# Patient Record
Sex: Female | Born: 1970 | Race: White | Marital: Married | Smoking: Never smoker
Health system: Southern US, Community
[De-identification: ages and names within clinical notes are randomized; demographics above are authoritative.]

## PROBLEM LIST (undated history)

## (undated) DIAGNOSIS — N921 Excessive and frequent menstruation with irregular cycle: Secondary | ICD-10-CM

## (undated) DIAGNOSIS — B965 Pseudomonas (aeruginosa) (mallei) (pseudomallei) as the cause of diseases classified elsewhere: Secondary | ICD-10-CM

## (undated) DIAGNOSIS — R519 Headache, unspecified: Secondary | ICD-10-CM

## (undated) DIAGNOSIS — E559 Vitamin D deficiency, unspecified: Secondary | ICD-10-CM

## (undated) HISTORY — DX: Morbid (severe) obesity due to excess calories: E66.01

## (undated) HISTORY — DX: Vitamin D deficiency, unspecified: E55.9

## (undated) HISTORY — DX: Excessive and frequent menstruation with irregular cycle: N92.1

## (undated) HISTORY — DX: Pseudomonas (aeruginosa) (mallei) (pseudomallei) as the cause of diseases classified elsewhere: B96.5

## (undated) HISTORY — DX: Headache, unspecified: R51.9

---

## 2019-05-31 ENCOUNTER — Other Ambulatory Visit: Payer: Self-pay | Admitting: Physician Assistant

## 2019-05-31 DIAGNOSIS — R519 Headache, unspecified: Secondary | ICD-10-CM

## 2019-06-07 ENCOUNTER — Ambulatory Visit
Admission: RE | Admit: 2019-06-07 | Discharge: 2019-06-07 | Disposition: A | Discharge status: Home / Self Care | Payer: Managed Care, Other (non HMO) | Source: Ambulatory Visit | Attending: Physician Assistant | Admitting: Physician Assistant

## 2019-06-07 DIAGNOSIS — R519 Headache, unspecified: Secondary | ICD-10-CM

## 2019-10-18 ENCOUNTER — Other Ambulatory Visit: Payer: Self-pay | Admitting: Physician Assistant

## 2019-12-01 ENCOUNTER — Other Ambulatory Visit: Payer: Self-pay | Admitting: Physician Assistant

## 2020-01-21 DIAGNOSIS — S90861A Insect bite (nonvenomous), right foot, initial encounter: Secondary | ICD-10-CM | POA: Diagnosis not present

## 2020-01-21 DIAGNOSIS — L03115 Cellulitis of right lower limb: Secondary | ICD-10-CM | POA: Diagnosis not present

## 2020-01-23 ENCOUNTER — Other Ambulatory Visit: Payer: Self-pay | Admitting: Physician Assistant

## 2020-01-23 MED ORDER — HYDROCHLOROTHIAZIDE 25 MG PO TABS: 25.0000 mg | ORAL_TABLET | Freq: Every day | ORAL | 0 refills | Status: DC

## 2020-01-31 ENCOUNTER — Other Ambulatory Visit: Payer: Self-pay | Admitting: Physician Assistant

## 2020-02-22 DIAGNOSIS — D225 Melanocytic nevi of trunk: Secondary | ICD-10-CM | POA: Diagnosis not present

## 2020-02-22 DIAGNOSIS — Z808 Family history of malignant neoplasm of other organs or systems: Secondary | ICD-10-CM | POA: Diagnosis not present

## 2020-02-22 DIAGNOSIS — L814 Other melanin hyperpigmentation: Secondary | ICD-10-CM | POA: Diagnosis not present

## 2020-02-22 DIAGNOSIS — L821 Other seborrheic keratosis: Secondary | ICD-10-CM | POA: Diagnosis not present

## 2020-02-29 ENCOUNTER — Other Ambulatory Visit: Payer: Self-pay

## 2020-03-05 ENCOUNTER — Other Ambulatory Visit: Payer: Self-pay | Admitting: Physician Assistant

## 2020-03-05 MED ORDER — HYDROCHLOROTHIAZIDE 25 MG PO TABS: 25.0000 mg | ORAL_TABLET | Freq: Every day | ORAL | 0 refills | Status: DC

## 2020-03-05 MED ORDER — VITAMIN D (ERGOCALCIFEROL) 1.25 MG (50000 UNIT) PO CAPS: ORAL_CAPSULE | ORAL | 0 refills | Status: DC

## 2020-03-06 ENCOUNTER — Other Ambulatory Visit: Payer: Self-pay

## 2020-03-06 ENCOUNTER — Ambulatory Visit: Payer: BC Managed Care – PPO | Admitting: Physician Assistant

## 2020-03-06 ENCOUNTER — Encounter: Payer: Self-pay | Admitting: Physician Assistant

## 2020-03-06 VITALS — BP 98/68 | HR 72 | Temp 97.7°F | Ht 65.0 in | Wt 188.0 lb

## 2020-03-06 DIAGNOSIS — E559 Vitamin D deficiency, unspecified: Secondary | ICD-10-CM

## 2020-03-06 DIAGNOSIS — Z83438 Family history of other disorder of lipoprotein metabolism and other lipidemia: Secondary | ICD-10-CM

## 2020-03-06 DIAGNOSIS — R600 Localized edema: Secondary | ICD-10-CM

## 2020-03-06 DIAGNOSIS — E782 Mixed hyperlipidemia: Secondary | ICD-10-CM | POA: Diagnosis not present

## 2020-03-06 MED ORDER — VITAMIN D (ERGOCALCIFEROL) 1.25 MG (50000 UNIT) PO CAPS: ORAL_CAPSULE | ORAL | 3 refills | Status: AC

## 2020-03-06 MED ORDER — PHENTERMINE HCL 37.5 MG PO CAPS
37.5000 mg | ORAL_CAPSULE | ORAL | 0 refills | Status: DC
Start: 2020-03-06 — End: 2020-04-03

## 2020-03-06 MED ORDER — HYDROCHLOROTHIAZIDE 25 MG PO TABS: 25.0000 mg | ORAL_TABLET | Freq: Every day | ORAL | 3 refills | Status: AC

## 2020-03-06 NOTE — Progress Notes (Signed)
Acute Office Visit  Subjective:    Patient ID: Nancy Ramsey, female    DOB: [birth year], 49 y.o.   MRN: [id number]  Chief Complaint  Patient presents with  . Vitamin D deficiency    HPI Patient is in today for Vit D deficiency  - is currently on supplements and due for recheck of labwork  Pt takes HCTZ qd for edema - states this medication works well for her and voices no concerns or problems  Pt had been on adipex in the past and would like to try again to help jump start weight loss - she is watching her diet and trying to exercise  Past Medical History:  Diagnosis Date  . Excessive and frequent menstruation with irregular cycle   . Headache   . Morbid (severe) obesity due to excess calories (HCC)   . Pseudomonas (aeruginosa) (mallei) (pseudomallei) as the cause of diseases classified elsewhere   . Vitamin D deficiency, unspecified     History reviewed. No pertinent surgical history.  Family History  Problem Relation Age of Onset  . Breast cancer Mother   . Stroke Father   . Diabetes Mellitus I Brother   . Bone cancer Maternal Grandmother   . Hyperlipidemia Other   . Hypertension Other   . Coronary artery disease Other   . Lung cancer Maternal Uncle   . Rectal cancer Maternal Uncle     Social History   Socioeconomic History  . Marital status: Married    Spouse name: Not on file  . Number of children: 2  . Years of education: Not on file  . Highest education level: Not on file  Occupational History  . Not on file  Tobacco Use  . Smoking status: Never Smoker  . Smokeless tobacco: Never Used  Vaping Use  . Vaping Use: Never used  Substance and Sexual Activity  . Alcohol use: Yes    Comment: Drinks approximately 1 time per week and when she drinks the average quanity of alcohol is 3-4 drinks. She typically consumes beer.  . Drug use: Never  . Sexual activity: Not on file  Other Topics Concern  . Not on file  Social History Narrative  . Not on file    Social Determinants of Health   Financial Resource Strain:   . Difficulty of Paying Living Expenses:   Food Insecurity:   . Worried About Programme researcher, broadcasting/film/video in the Last Year:   . Barista in the Last Year:   Transportation Needs:   . Freight forwarder (Medical):   Marland Kitchen Lack of Transportation (Non-Medical):   Physical Activity:   . Days of Exercise per Week:   . Minutes of Exercise per Session:   Stress:   . Feeling of Stress :   Social Connections:   . Frequency of Communication with Friends and Family:   . Frequency of Social Gatherings with Friends and Family:   . Attends Religious Services:   . Active Member of Clubs or Organizations:   . Attends Banker Meetings:   Marland Kitchen Marital Status:   Intimate Partner Violence:   . Fear of Current or Ex-Partner:   . Emotionally Abused:   Marland Kitchen Physically Abused:   . Sexually Abused:      Current Outpatient Medications:  .  hydrochlorothiazide (HYDRODIURIL) 25 MG tablet, Take 1 tablet (25 mg total) by mouth daily., Disp: 90 tablet, Rfl: 3 .  Vitamin D, Ergocalciferol, (DRISDOL) 1.25 MG (50000 UNIT) CAPS  capsule, TAKE 1 CAPSULE BY MOUTH TWICE A WEEK., Disp: 24 capsule, Rfl: 3 .  phentermine 37.5 MG capsule, Take 1 capsule (37.5 mg total) by mouth every morning., Disp: 30 capsule, Rfl: 0   Allergies  Allergen Reactions  . Minocycline Swelling  . Sulfamethoxazole Other (See Comments)    Unknown    CONSTITUTIONAL: Negative for chills, fatigue, fever, unintentional weight gain and unintentional weight loss.  CARDIOVASCULAR: Negative for chest pain, dizziness, palpitations and pedal edema.  RESPIRATORY: Negative for recent cough and dyspnea.  GASTROINTESTINAL: Negative for abdominal pain, acid reflux symptoms, constipation, diarrhea, nausea and vomiting.  MSK: Negative for arthralgias and myalgias.  INTEGUMENTARY: Negative for rash.  PSYCHIATRIC: Negative for sleep disturbance and to question depression screen.   Negative for depression, negative for anhedonia.         Objective:    PHYSICAL EXAM:   VS: BP 98/68 (BP Location: Left Arm, Patient Position: Sitting)   Pulse 72   Temp 97.7 F (36.5 C) (Temporal)   Ht 5\' 5"  (1.651 m)   Wt 188 lb (85.3 kg)   SpO2 99%   BMI 31.28 kg/m   GEN: Well nourished, well developed, in no acute distress  Cardiac: RRR; no murmurs, rubs, or gallops,no edema - no significant varicosities Respiratory:  normal respiratory rate and pattern with no distress - normal breath sounds with no rales, rhonchi, wheezes or rubs  Neuro:  Alert and Oriented x 3, Strength and sensation are intact - CN II-Xii grossly intact Psych: euthymic mood, appropriate affect and demeanor   Wt Readings from Last 3 Encounters:  03/06/20 188 lb (85.3 kg)    Health Maintenance Due  Topic Date Due  . Hepatitis C Screening  Never done  . HIV Screening  Never done  . TETANUS/TDAP  Never done  . PAP SMEAR-Modifier  Never done    There are no preventive care reminders to display for this patient.        Assessment & Plan:   Problem List Items Addressed This Visit    None    Visit Diagnoses    Vitamin D insufficiency    -  Primary   Relevant Medications   Vitamin D, Ergocalciferol, (DRISDOL) 1.25 MG (50000 UNIT) CAPS capsule   Other Relevant Orders   VITAMIN D 25 Hydroxy (Vit-D Deficiency, Fractures)   Localized edema       Relevant Medications   hydrochlorothiazide (HYDRODIURIL) 25 MG tablet   Other Relevant Orders   CBC with Differential/Platelet   Comprehensive metabolic panel   TSH   Mixed hyperlipidemia       Relevant Medications   hydrochlorothiazide (HYDRODIURIL) 25 MG tablet   Family history of elevated blood lipids       Relevant Orders   Lipid panel       Meds ordered this encounter  Medications  . hydrochlorothiazide (HYDRODIURIL) 25 MG tablet    Sig: Take 1 tablet (25 mg total) by mouth daily.    Dispense:  90 tablet    Refill:  3    * * N O T  I C E * * Last quantity doesn't match original quantity    Order Specific Question:   Supervising Provider    Answer07/01/21  . Vitamin D, Ergocalciferol, (DRISDOL) 1.25 MG (50000 UNIT) CAPS capsule    Sig: TAKE 1 CAPSULE BY MOUTH TWICE A WEEK.    Dispense:  24 capsule    Refill:  3  Order Specific Question:   Supervising Provider    AnswerBlane Ohara [id number]  . phentermine 37.5 MG capsule    Sig: Take 1 capsule (37.5 mg total) by mouth every morning.    Dispense:  30 capsule    Refill:  0    Order Specific Question:   Supervising Provider    Answer:   COX, KIRSTEN [[id number]]     SARA R Rani Sisney, PA-C

## 2020-03-09 LAB — COMPREHENSIVE METABOLIC PANEL
ALT: 21 IU/L (ref 0–32)
AST: 16 IU/L (ref 0–40)
Albumin/Globulin Ratio: 1.5 (ref 1.2–2.2)
Albumin: 4.7 g/dL (ref 3.8–4.9)
Alkaline Phosphatase: 68 IU/L (ref 48–121)
BUN/Creatinine Ratio: 16 (ref 9–23)
BUN: 16 mg/dL (ref 6–24)
Bilirubin Total: 0.5 mg/dL (ref 0.0–1.2)
CO2: 23 mmol/L (ref 20–29)
Calcium: 9.5 mg/dL (ref 8.7–10.2)
Chloride: 98 mmol/L (ref 96–106)
Creatinine, Ser: 0.98 mg/dL (ref 0.57–1.00)
GFR calc Af Amer: 77 mL/min/{1.73_m2} (ref >59)
GFR calc non Af Amer: 67 mL/min/{1.73_m2} (ref >59)
Globulin, Total: 3.1 g/dL (ref 1.5–4.5)
Glucose: 86 mg/dL (ref 65–99)
Potassium: 4.2 mmol/L (ref 3.5–5.2)
Sodium: 135 mmol/L (ref 134–144)
Total Protein: 7.8 g/dL (ref 6.0–8.5)

## 2020-03-09 LAB — CBC WITH DIFFERENTIAL/PLATELET
Basophils Absolute: 0.1 10*3/uL (ref 0.0–0.2)
Basos: 1 %
EOS (ABSOLUTE): 0.3 10*3/uL (ref 0.0–0.4)
Eos: 3 %
Hematocrit: 42.7 % (ref 34.0–46.6)
Hemoglobin: 14.5 g/dL (ref 11.1–15.9)
Immature Grans (Abs): 0 10*3/uL (ref 0.0–0.1)
Immature Granulocytes: 1 %
Lymphocytes Absolute: 2.5 10*3/uL (ref 0.7–3.1)
Lymphs: 29 %
MCH: 31.9 pg (ref 26.6–33.0)
MCHC: 34 g/dL (ref 31.5–35.7)
MCV: 94 fL (ref 79–97)
Monocytes Absolute: 0.8 10*3/uL (ref 0.1–0.9)
Monocytes: 9 %
Neutrophils Absolute: 5.2 10*3/uL (ref 1.4–7.0)
Neutrophils: 57 %
Platelets: 319 10*3/uL (ref 150–450)
RBC: 4.55 x10E6/uL (ref 3.77–5.28)
RDW: 12.4 % (ref 11.7–15.4)
WBC: 8.9 10*3/uL (ref 3.4–10.8)

## 2020-03-09 LAB — LIPID PANEL
Chol/HDL Ratio: 3.8 ratio (ref 0.0–4.4)
Cholesterol, Total: 204 mg/dL — ABNORMAL HIGH (ref 100–199)
HDL: 53 mg/dL (ref >39)
LDL Chol Calc (NIH): 135 mg/dL — ABNORMAL HIGH (ref 0–99)
Triglycerides: 91 mg/dL (ref 0–149)
VLDL Cholesterol Cal: 16 mg/dL (ref 5–40)

## 2020-03-09 LAB — CARDIOVASCULAR RISK ASSESSMENT

## 2020-03-09 LAB — TSH: TSH: 2.36 u[IU]/mL (ref 0.450–4.500)

## 2020-03-09 LAB — VITAMIN D 25 HYDROXY (VIT D DEFICIENCY, FRACTURES): Vit D, 25-Hydroxy: 52.4 ng/mL (ref 30.0–100.0)

## 2020-04-03 ENCOUNTER — Other Ambulatory Visit: Payer: Self-pay | Admitting: Physician Assistant

## 2020-04-03 MED ORDER — PHENTERMINE HCL 37.5 MG PO CAPS: 37.5000 mg | ORAL_CAPSULE | ORAL | 0 refills | Status: AC

## 2020-05-24 ENCOUNTER — Telehealth: Payer: Self-pay

## 2020-05-24 ENCOUNTER — Other Ambulatory Visit: Payer: Self-pay | Admitting: Physician Assistant

## 2020-05-24 MED ORDER — NITROFURANTOIN MONOHYD MACRO 100 MG PO CAPS
100.0000 mg | ORAL_CAPSULE | Freq: Two times a day (BID) | ORAL | 0 refills | Status: AC
Start: 2020-05-24 — End: ?

## 2020-05-24 NOTE — Telephone Encounter (Signed)
Notify will send in rx - if symptoms worsens needs to be seen

## 2020-05-24 NOTE — Telephone Encounter (Signed)
Pt called and sts she is out of town and is starting to have frequent urination, and burning. Pt took AZO but sts it is not helping. Wants to know if you can send her in something for a UTI? If so can you send it to pharmacy: [facility] [id number] in [facility]. jm

## 2020-06-15 DIAGNOSIS — Z01419 Encounter for gynecological examination (general) (routine) without abnormal findings: Secondary | ICD-10-CM | POA: Diagnosis not present

## 2020-06-15 DIAGNOSIS — N390 Urinary tract infection, site not specified: Secondary | ICD-10-CM | POA: Diagnosis not present

## 2020-06-15 DIAGNOSIS — Z23 Encounter for immunization: Secondary | ICD-10-CM | POA: Diagnosis not present

## 2020-06-15 DIAGNOSIS — Z803 Family history of malignant neoplasm of breast: Secondary | ICD-10-CM | POA: Diagnosis not present

## 2020-06-15 DIAGNOSIS — Z1231 Encounter for screening mammogram for malignant neoplasm of breast: Secondary | ICD-10-CM | POA: Diagnosis not present

## 2020-06-15 DIAGNOSIS — Z87440 Personal history of urinary (tract) infections: Secondary | ICD-10-CM | POA: Diagnosis not present

## 2020-06-15 DIAGNOSIS — N72 Inflammatory disease of cervix uteri: Secondary | ICD-10-CM | POA: Diagnosis not present

## 2020-06-15 DIAGNOSIS — Z1151 Encounter for screening for human papillomavirus (HPV): Secondary | ICD-10-CM | POA: Diagnosis not present

## 2020-10-10 DIAGNOSIS — Z1231 Encounter for screening mammogram for malignant neoplasm of breast: Secondary | ICD-10-CM | POA: Diagnosis not present

## 2020-11-08 IMAGING — CT CT HEAD W/O CM
1 series · 16 of 30 positions shown, 20 images · non-contrast
Comparison: None.

CLINICAL DATA: Headaches with apparent right visual field
abnormality transiently

EXAM:
CT HEAD WITHOUT CONTRAST
TECHNIQUE: Contiguous axial images were obtained from the base of the skull
through the vertex without intravenous contrast.

[Series 2: head w/(date) · axial · 0.44mm/px · z∈[-150,+0]mm · 16 of 34 slices shown, 20 images]
[im 2/34  brain]
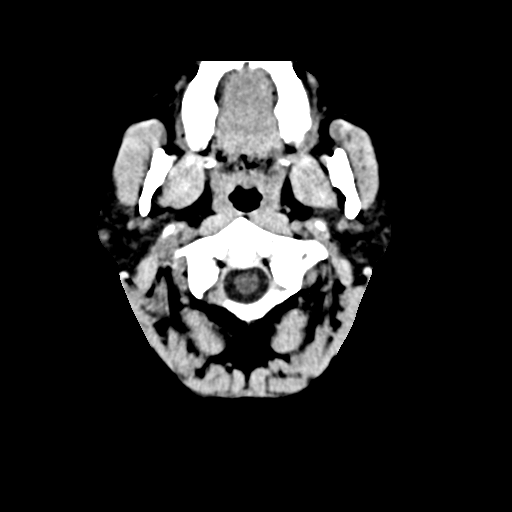
[im 2/34  bone]
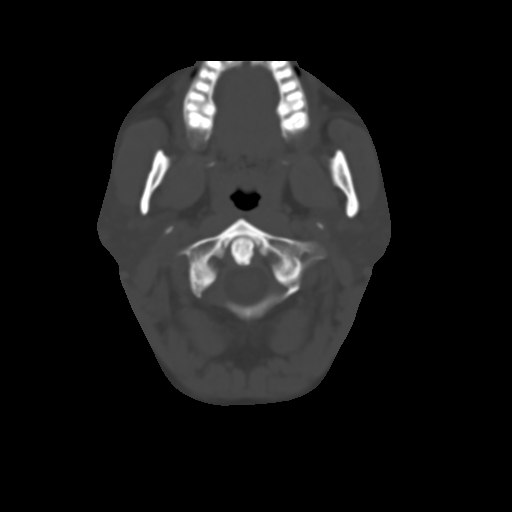
[im 4/34  brain]
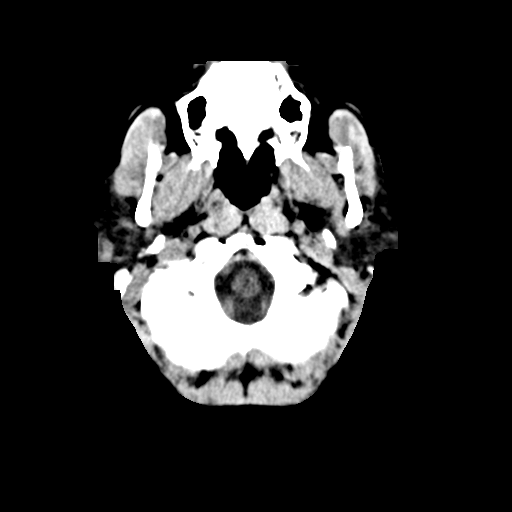
[im 6/34  brain]
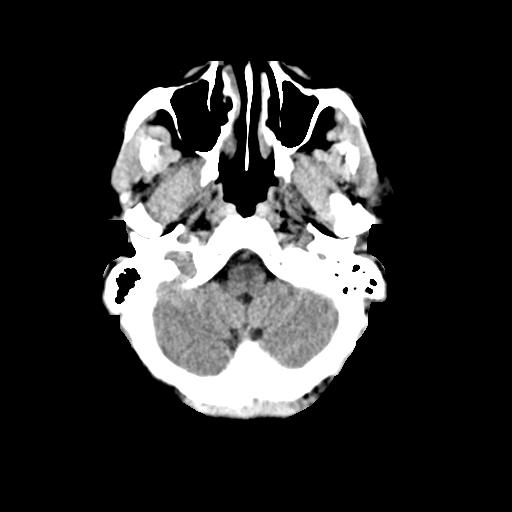
[im 8/34  brain]
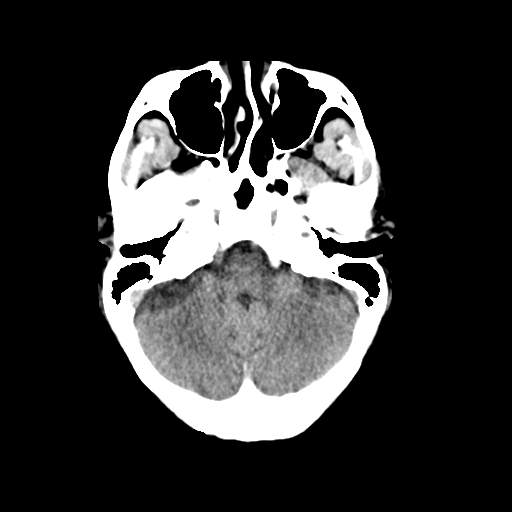
[im 10/34  brain]
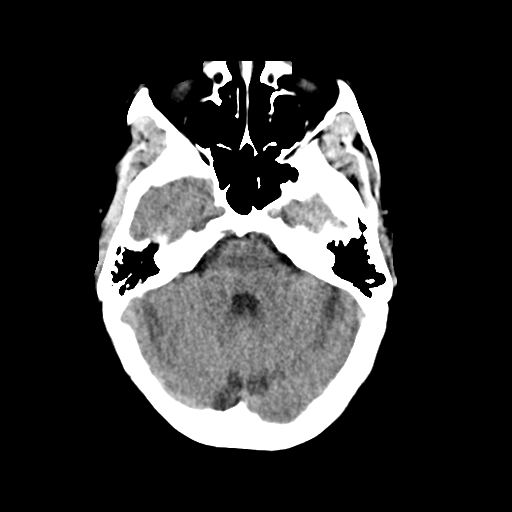
[im 10/34  bone]
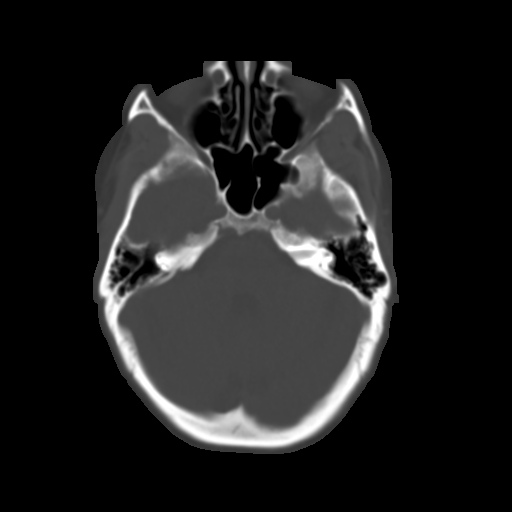
[im 12/34  brain]
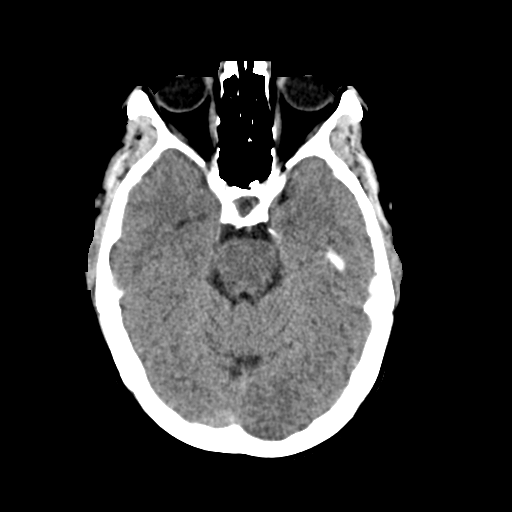
[im 14/34  brain]
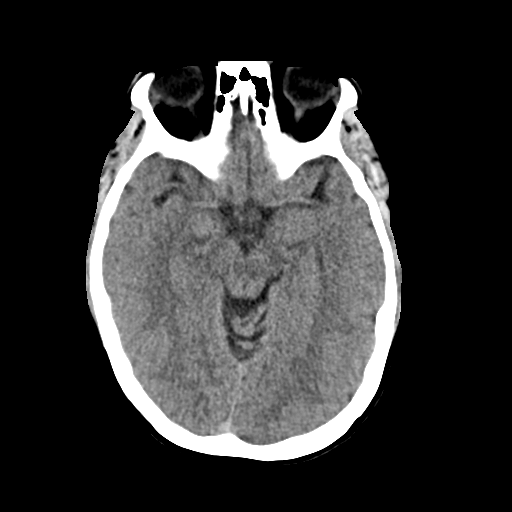
[im 16/34  brain]
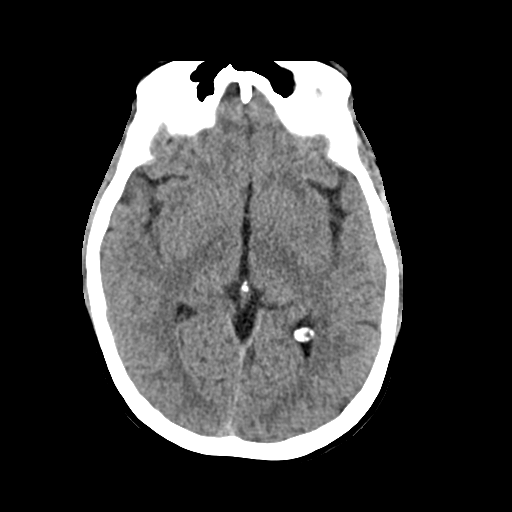
[im 18/34  brain]
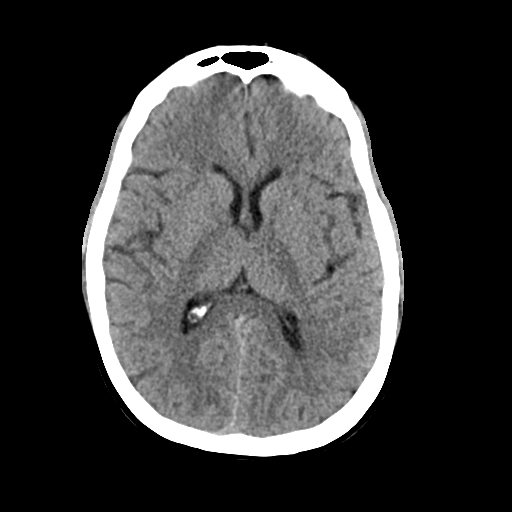
[im 18/34  bone]
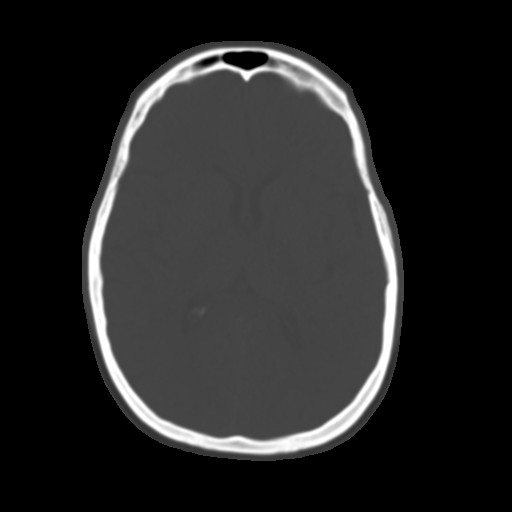
[im 20/34  brain]
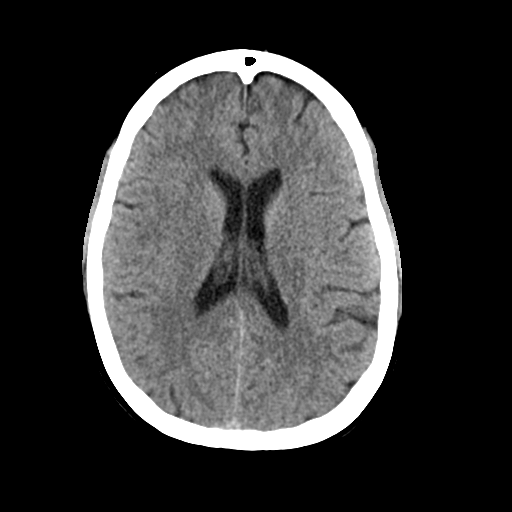
[im 22/34  brain]
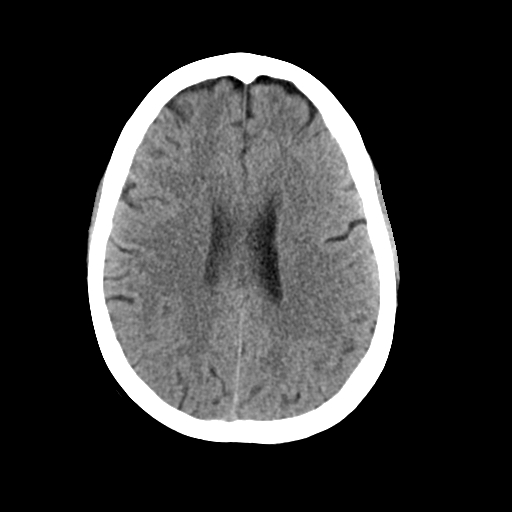
[im 24/34  brain]
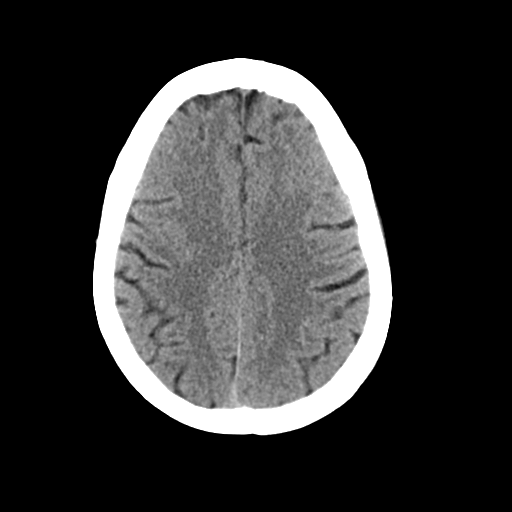
[im 26/34  brain]
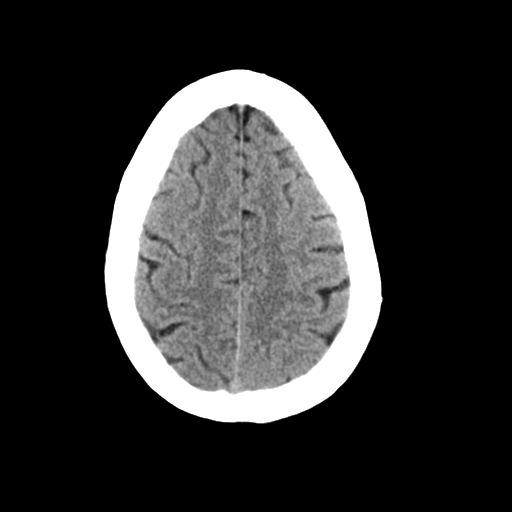
[im 26/34  bone]
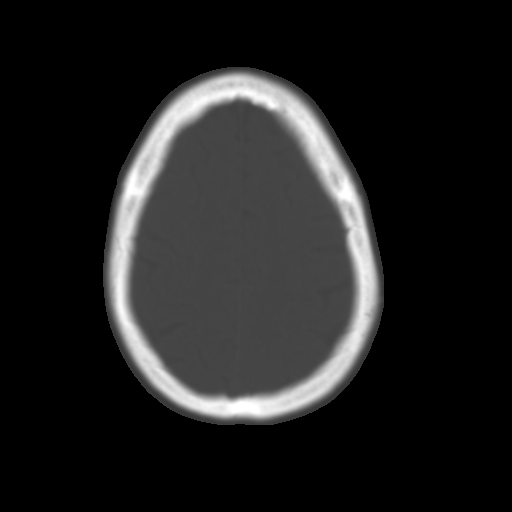
[im 28/34  brain]
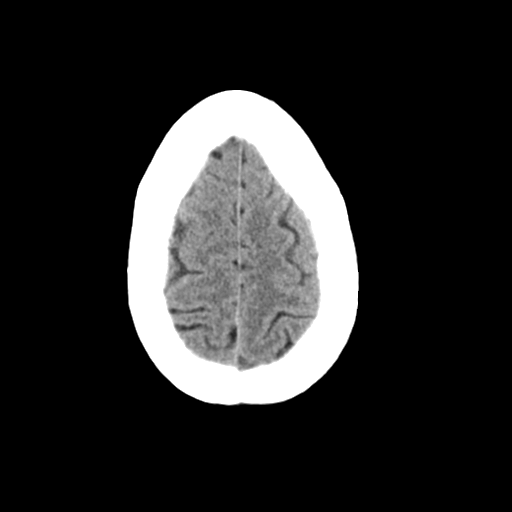
[im 30/34  brain]
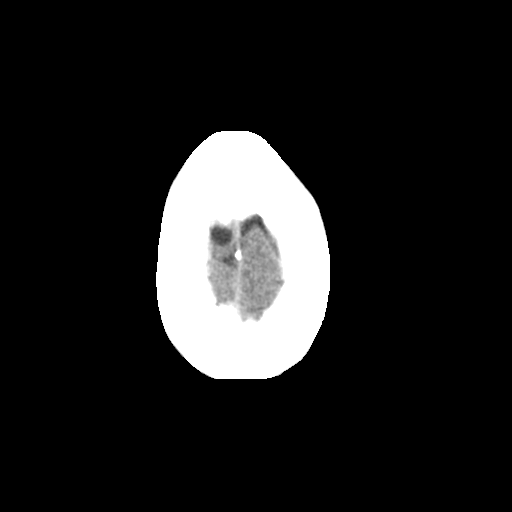
[im 32/34  brain]
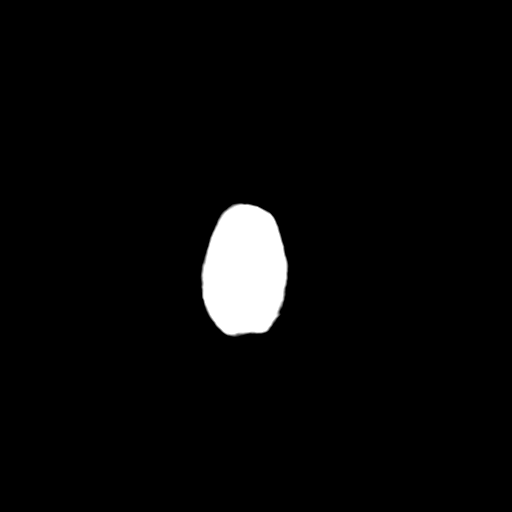

[16 of 30 positions shown; findings below may reference images not displayed]

FINDINGS: Brain: The ventricles are normal in size and configuration. There is
no intracranial mass, hemorrhage, extra-axial fluid collection, or
midline shift. Brain parenchyma appears unremarkable. No acute
infarct evident.

Vascular: No hyperdense vessel.  No evident vascular calcification.

Skull: Bony calvarium appears intact.

Sinuses/Orbits: Paranasal sinuses are clear. Orbits appear symmetric
bilaterally.

Other: Mastoid air cells are clear.
IMPRESSION: Study within normal limits.

## 2021-01-28 DIAGNOSIS — M7732 Calcaneal spur, left foot: Secondary | ICD-10-CM | POA: Diagnosis not present

## 2021-01-28 DIAGNOSIS — M722 Plantar fascial fibromatosis: Secondary | ICD-10-CM | POA: Diagnosis not present

## 2021-01-28 DIAGNOSIS — M7731 Calcaneal spur, right foot: Secondary | ICD-10-CM | POA: Diagnosis not present

## 2021-01-28 DIAGNOSIS — M79671 Pain in right foot: Secondary | ICD-10-CM | POA: Diagnosis not present

## 2021-02-06 DIAGNOSIS — J019 Acute sinusitis, unspecified: Secondary | ICD-10-CM | POA: Diagnosis not present

## 2021-02-12 DIAGNOSIS — M722 Plantar fascial fibromatosis: Secondary | ICD-10-CM | POA: Diagnosis not present

## 2021-02-12 DIAGNOSIS — M7731 Calcaneal spur, right foot: Secondary | ICD-10-CM | POA: Diagnosis not present

## 2021-02-12 DIAGNOSIS — M7732 Calcaneal spur, left foot: Secondary | ICD-10-CM | POA: Diagnosis not present

## 2021-03-24 ENCOUNTER — Other Ambulatory Visit: Payer: Self-pay | Admitting: Physician Assistant

## 2021-03-24 DIAGNOSIS — E559 Vitamin D deficiency, unspecified: Secondary | ICD-10-CM

## 2021-05-03 DIAGNOSIS — R300 Dysuria: Secondary | ICD-10-CM | POA: Diagnosis not present

## 2021-05-09 DIAGNOSIS — R822 Biliuria: Secondary | ICD-10-CM | POA: Diagnosis not present

## 2021-05-09 DIAGNOSIS — R3129 Other microscopic hematuria: Secondary | ICD-10-CM | POA: Diagnosis not present

## 2021-05-09 DIAGNOSIS — Z6831 Body mass index (BMI) 31.0-31.9, adult: Secondary | ICD-10-CM | POA: Diagnosis not present

## 2021-05-09 DIAGNOSIS — R81 Glycosuria: Secondary | ICD-10-CM | POA: Diagnosis not present

## 2021-06-04 DIAGNOSIS — D485 Neoplasm of uncertain behavior of skin: Secondary | ICD-10-CM | POA: Diagnosis not present

## 2021-06-04 DIAGNOSIS — L817 Pigmented purpuric dermatosis: Secondary | ICD-10-CM | POA: Diagnosis not present

## 2021-06-04 DIAGNOSIS — C44519 Basal cell carcinoma of skin of other part of trunk: Secondary | ICD-10-CM | POA: Diagnosis not present

## 2021-06-04 DIAGNOSIS — D229 Melanocytic nevi, unspecified: Secondary | ICD-10-CM | POA: Diagnosis not present

## 2021-06-17 DIAGNOSIS — C44519 Basal cell carcinoma of skin of other part of trunk: Secondary | ICD-10-CM | POA: Diagnosis not present

## 2021-06-19 DIAGNOSIS — E538 Deficiency of other specified B group vitamins: Secondary | ICD-10-CM | POA: Diagnosis not present

## 2021-06-19 DIAGNOSIS — Z01419 Encounter for gynecological examination (general) (routine) without abnormal findings: Secondary | ICD-10-CM | POA: Diagnosis not present

## 2021-06-19 DIAGNOSIS — E038 Other specified hypothyroidism: Secondary | ICD-10-CM | POA: Diagnosis not present

## 2021-06-19 DIAGNOSIS — Z13220 Encounter for screening for lipoid disorders: Secondary | ICD-10-CM | POA: Diagnosis not present

## 2021-06-19 DIAGNOSIS — Z23 Encounter for immunization: Secondary | ICD-10-CM | POA: Diagnosis not present

## 2021-06-19 DIAGNOSIS — Z1231 Encounter for screening mammogram for malignant neoplasm of breast: Secondary | ICD-10-CM | POA: Diagnosis not present

## 2021-06-19 DIAGNOSIS — E559 Vitamin D deficiency, unspecified: Secondary | ICD-10-CM | POA: Diagnosis not present

## 2021-09-02 DIAGNOSIS — U071 COVID-19: Secondary | ICD-10-CM | POA: Diagnosis not present

## 2021-09-02 DIAGNOSIS — R0981 Nasal congestion: Secondary | ICD-10-CM | POA: Diagnosis not present

## 2021-09-02 DIAGNOSIS — J029 Acute pharyngitis, unspecified: Secondary | ICD-10-CM | POA: Diagnosis not present
